# Patient Record
Sex: Female | Born: 1975 | Race: Black or African American | Hispanic: No | Marital: Married | Smoking: Current every day smoker
Health system: Southern US, Community
[De-identification: ages and names within clinical notes are randomized; demographics above are authoritative.]

## PROBLEM LIST (undated history)

## (undated) HISTORY — PX: CHOLECYSTECTOMY: SHX55

## (undated) HISTORY — PX: SHOULDER SURGERY: SHX246

---

## 2019-01-19 ENCOUNTER — Emergency Department (HOSPITAL_COMMUNITY): Payer: Self-pay

## 2019-01-19 ENCOUNTER — Emergency Department (HOSPITAL_COMMUNITY)
Admission: EM | Admit: 2019-01-19 | Discharge: 2019-01-19 | Disposition: A | Discharge status: Home / Self Care | Payer: Self-pay | Attending: Emergency Medicine | Admitting: Emergency Medicine

## 2019-01-19 ENCOUNTER — Encounter (HOSPITAL_COMMUNITY): Payer: Self-pay | Admitting: Family Medicine

## 2019-01-19 DIAGNOSIS — F17290 Nicotine dependence, other tobacco product, uncomplicated: Secondary | ICD-10-CM | POA: Insufficient documentation

## 2019-01-19 DIAGNOSIS — M25511 Pain in right shoulder: Secondary | ICD-10-CM | POA: Insufficient documentation

## 2019-01-19 MED ORDER — IBUPROFEN 200 MG PO TABS
600.0000 mg | ORAL_TABLET | Freq: Once | ORAL | Status: AC
  Administered 2019-01-19: 600 mg via ORAL
  Filled 2019-01-19: qty 3

## 2019-01-19 NOTE — ED Triage Notes (Signed)
Patient is from home and states her right shoulder is dislocated. Patient states she was attempting to get off the bed when this occurred and reports having a past occurrence of this.

## 2019-01-19 NOTE — Progress Notes (Signed)
Orthopedic Tech Progress Note Patient Details:  Caroline Lynn [birth year] [id number] Pt refused sling said by the tech.         Ladell Pier [facility] 01/19/2019, 9:39 PM

## 2019-01-19 NOTE — ED Notes (Signed)
Pt refused Sling Immoblizer

## 2019-01-19 NOTE — ED Provider Notes (Signed)
[facility] Provider Note   CSN: [id number] Arrival date & time: 01/19/19  1948     History   Chief Complaint Chief Complaint  Patient presents with  . Shoulder Pain    HPI Caroline Lynn is a 43 y.o. female.     HPI Patient presents emergency room for evaluation of shoulder pain.  Patient has a history of prior shoulder surgery.  She was moving her arm this evening when she felt like it popped out of place and dislocated.  Patient denies any falls.  She denies any numbness or weakness.  She denies any other injuries. History reviewed. No pertinent past medical history.  There are no active problems to display for this patient.   Past Surgical History:  Procedure Laterality Date  . CHOLECYSTECTOMY    . SHOULDER SURGERY Bilateral      OB History   No obstetric history on file.      Home Medications    Prior to Admission medications   Not on File    Family History History reviewed. No pertinent family history.  Social History Social History   Tobacco Use  . Smoking status: Current Every Day Smoker    Types: E-cigarettes  . Smokeless tobacco: Never Used  Substance Use Topics  . Alcohol use: Not Currently  . Drug use: Not Currently     Allergies   Patient has no allergy information on record.   Review of Systems Review of Systems  All other systems reviewed and are negative.    Physical Exam Updated Vital Signs BP (!) 147/102   Pulse 79   Temp 98.8 F (37.1 C) (Oral)   Resp 20   Ht 1.575 m (5\' 2" )   Wt 95.3 kg   SpO2 99%   BMI 38.41 kg/m   Physical Exam Vitals signs and nursing note reviewed.  Constitutional:      General: She is not in acute distress.    Appearance: She is well-developed.  HENT:     Head: Normocephalic and atraumatic.     Right Ear: External ear normal.     Left Ear: External ear normal.  Eyes:     General: No scleral icterus.       Right eye: No discharge.        Left eye:  No discharge.     Conjunctiva/sclera: Conjunctivae normal.  Neck:     Musculoskeletal: Neck supple.     Trachea: No tracheal deviation.  Cardiovascular:     Rate and Rhythm: Normal rate.  Pulmonary:     Effort: Pulmonary effort is normal. No respiratory distress.     Breath sounds: No stridor.  Abdominal:     General: There is no distension.  Musculoskeletal:        General: No swelling or deformity.     Right shoulder: She exhibits tenderness.     Right elbow: Normal.    Right wrist: Normal.     Comments: Evidence of prior shoulder surgery, no ecchymoses, no erythema  Skin:    General: Skin is warm and dry.     Findings: No rash.  Neurological:     Mental Status: She is alert.     Cranial Nerves: Cranial nerve deficit: no gross deficits.      ED Treatments / Results  Labs (all labs ordered are listed, but only abnormal results are displayed) Labs Reviewed - No data to display  EKG    Radiology Dg Shoulder Right  Result Date: 01/19/2019 CLINICAL DATA:  Possible dislocation. EXAM: RIGHT SHOULDER - 2+ VIEW COMPARISON:  None. FINDINGS: Patient is status post prior total shoulder arthroplasty. There is no evidence of a dislocation. There are small well corticated fragments about the glenoid which are likely chronic. There is a small metallic foreign body adjacent to the proximal aspect of the humerus. IMPRESSION: No evidence of a glenohumeral dislocation. Electronically Signed   By: Constance Holster M.D.   On: 01/19/2019 20:53    Procedures Procedures (including critical care time)  Medications Ordered in ED Medications  ibuprofen (ADVIL) tablet 600 mg (600 mg Oral Given 01/19/19 2126)     Initial Impression / Assessment and Plan / ED Course  I have reviewed the triage vital signs and the nursing notes.  Pertinent labs & imaging results that were available during my care of the patient were reviewed by me and considered in my medical decision making (see chart for  details).   Patient's x-ray does not show evidence of dislocation.  I ordered a sling for comfort.  Patient should take over-the-counter medications and follow-up with orthopedic doctor.  Final Clinical Impressions(s) / ED Diagnoses   Final diagnoses:  Acute pain of right shoulder    ED Discharge Orders    None       Dorie Rank, MD 01/19/19 2131

## 2019-01-19 NOTE — Discharge Instructions (Addendum)
Take over-the-counter medication such as ibuprofen as needed for pain, follow-up with your orthopedic doctor for further evaluation

## 2020-01-20 IMAGING — CR RIGHT SHOULDER - 2+ VIEW
3 series · 3 of 3 positions shown · non-contrast
Comparison: None.

CLINICAL DATA: Possible dislocation.

EXAM:
RIGHT SHOULDER - 2+ VIEW

[w shoulder external right (1 of 3)]
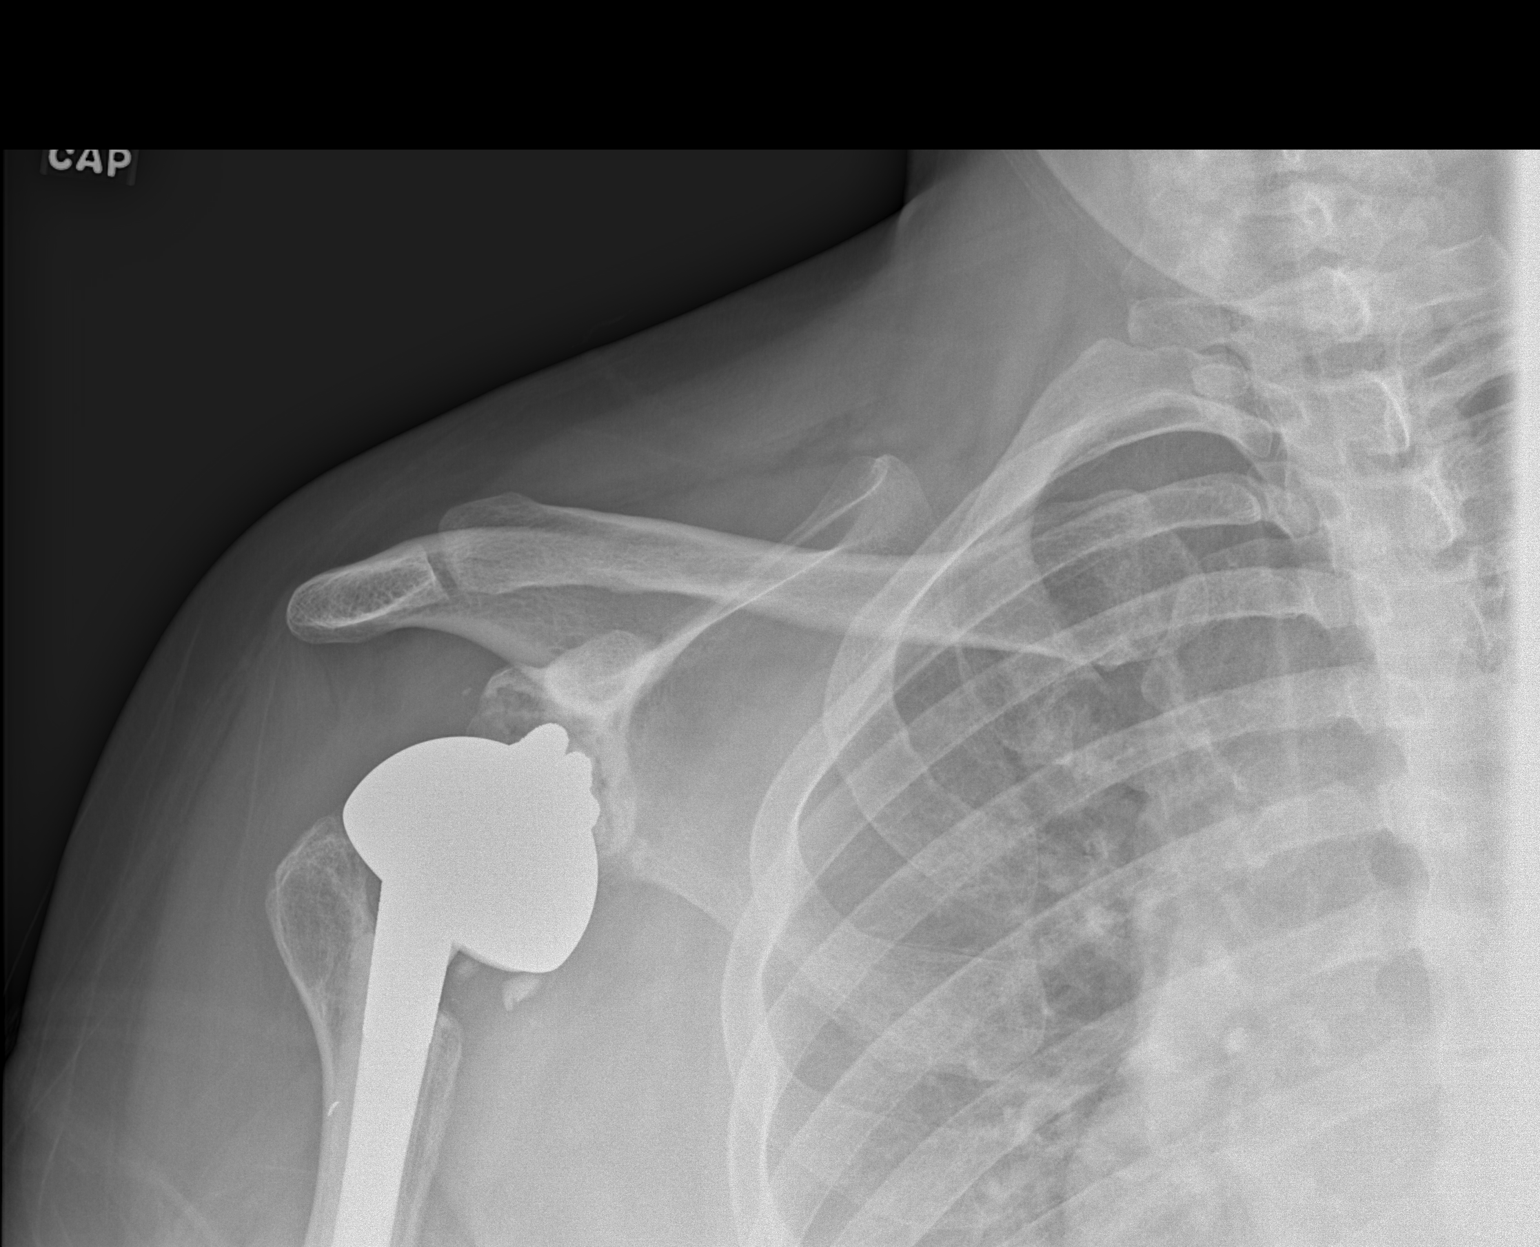

[w shoulder external right (2 of 3)]
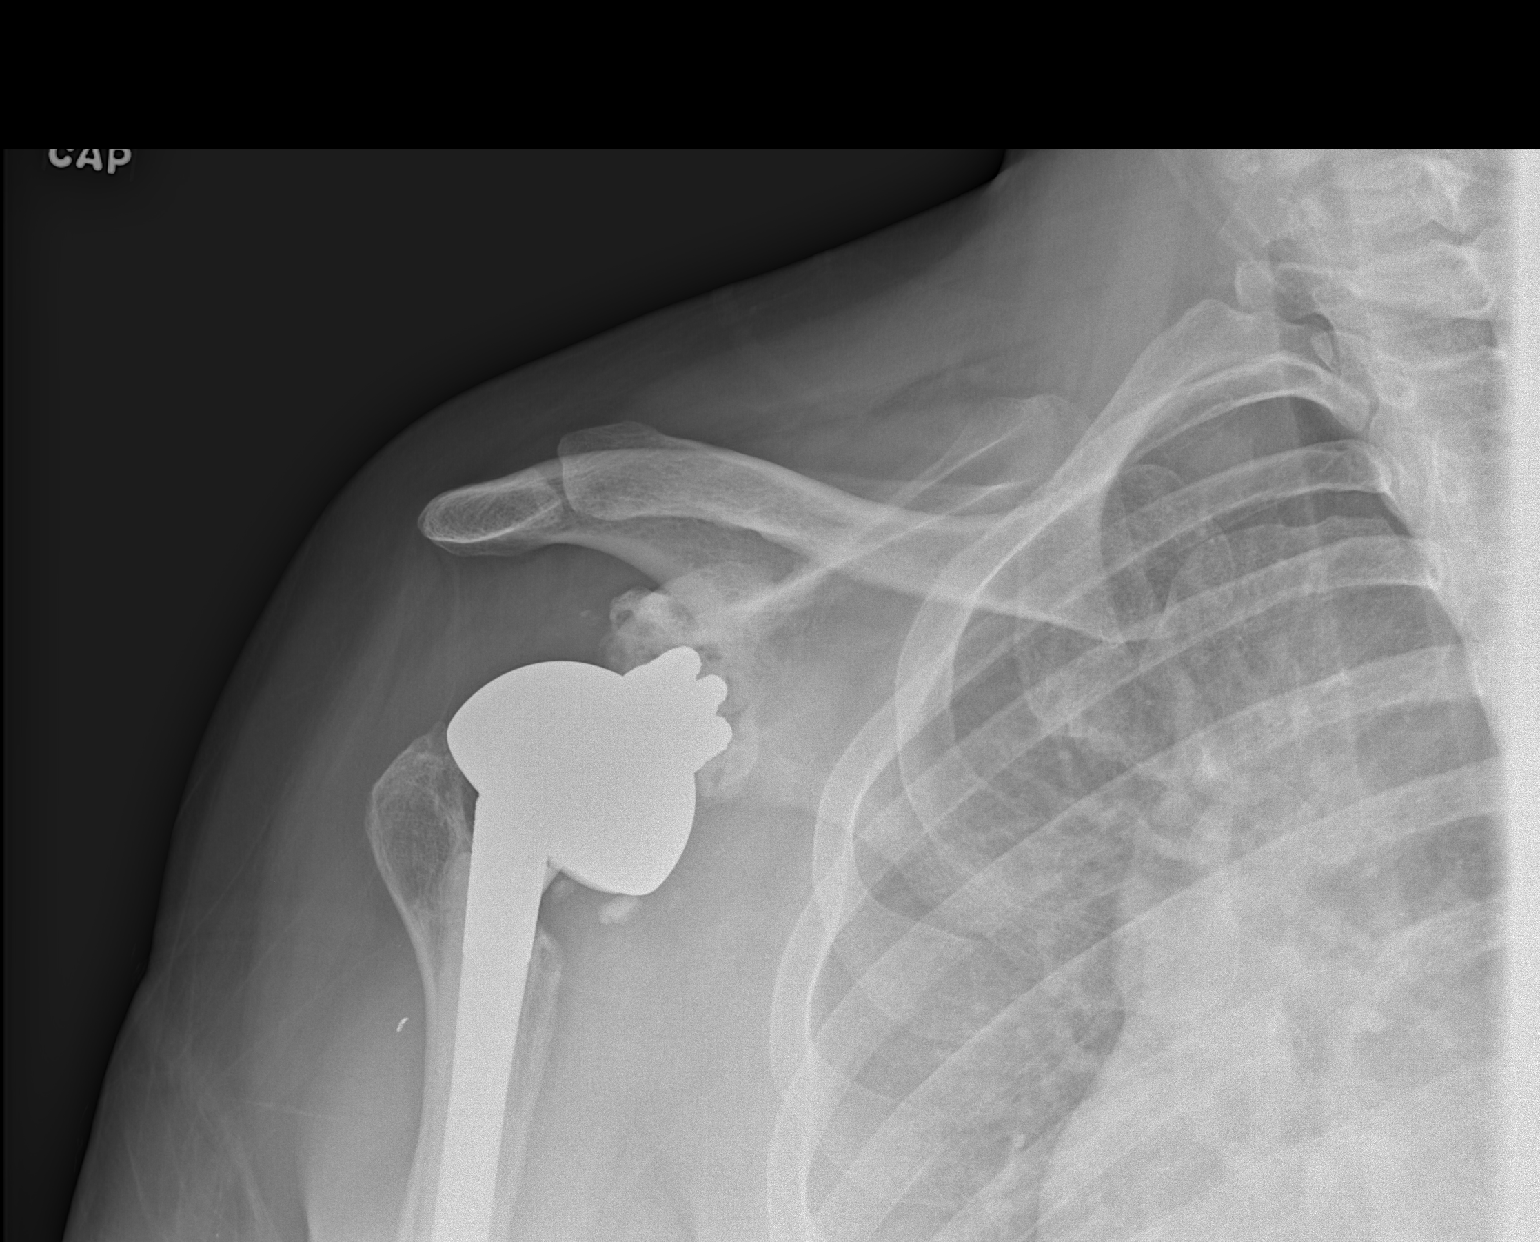

[w shoulder external right (3 of 3)]
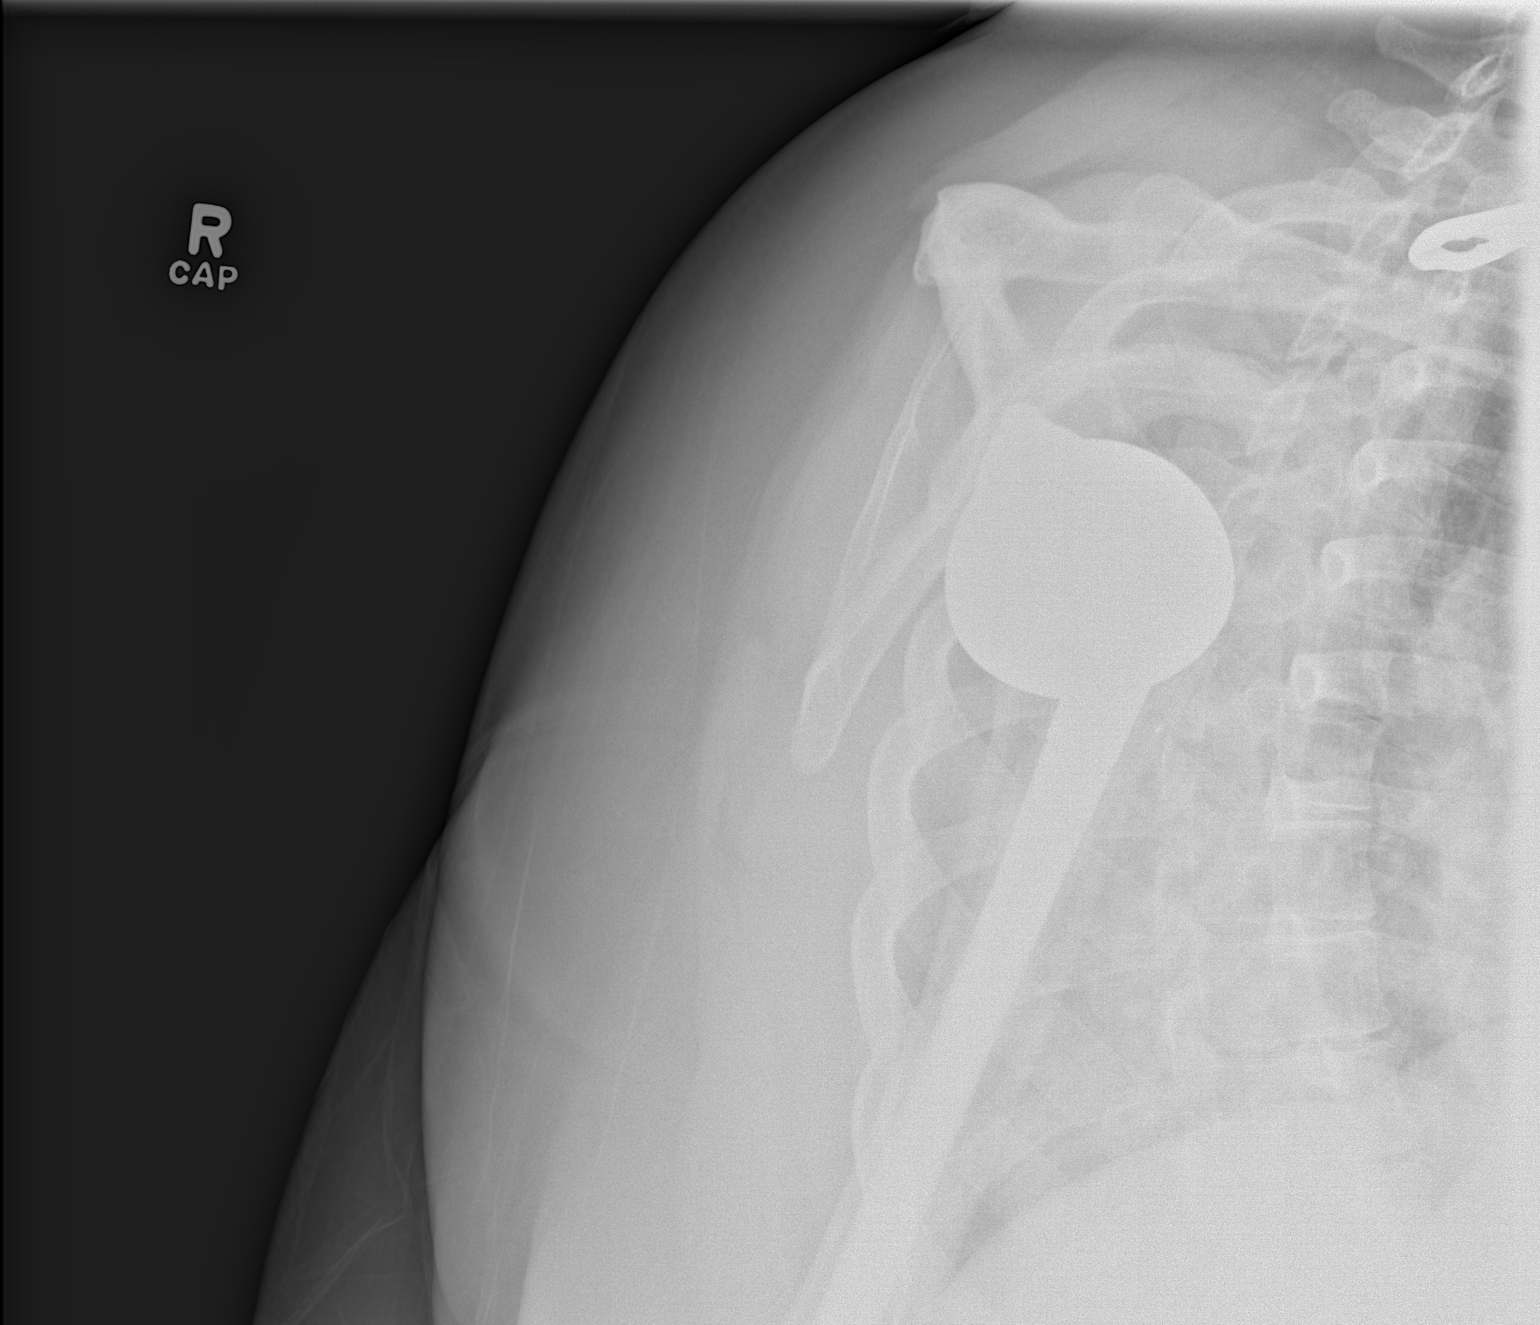

[3 of 3 positions shown; findings below may reference images not displayed]

FINDINGS: Patient is status post prior total shoulder arthroplasty. There is
no evidence of a dislocation. There are small well corticated
fragments about the glenoid which are likely chronic. There is a
small metallic foreign body adjacent to the proximal aspect of the
humerus.
IMPRESSION: No evidence of a glenohumeral dislocation.
